# Patient Record
Sex: Female | Born: 1975 | Race: White | Hispanic: Yes | Marital: Single | Smoking: Never smoker
Health system: Southern US, Community
[De-identification: ages and names within clinical notes are randomized; demographics above are authoritative.]

## PROBLEM LIST (undated history)

## (undated) DIAGNOSIS — Z789 Other specified health status: Secondary | ICD-10-CM

## (undated) HISTORY — DX: Other specified health status: Z78.9

---

## 2007-03-16 ENCOUNTER — Inpatient Hospital Stay (HOSPITAL_COMMUNITY): Admission: AD | Admit: 2007-03-16 | Discharge: 2007-03-18 | Payer: Self-pay | Admitting: Obstetrics & Gynecology

## 2010-07-13 ENCOUNTER — Encounter: Payer: Self-pay | Admitting: Family Medicine

## 2011-04-02 LAB — CBC
HCT: 31.3 — ABNORMAL LOW
Hemoglobin: 10.8 — ABNORMAL LOW
MCHC: 34.4
RBC: 3.57 — ABNORMAL LOW
WBC: 15.4 — ABNORMAL HIGH
WBC: 20.6 — ABNORMAL HIGH

## 2017-04-21 ENCOUNTER — Other Ambulatory Visit: Payer: Self-pay | Admitting: Obstetrics & Gynecology

## 2017-04-21 DIAGNOSIS — Z1231 Encounter for screening mammogram for malignant neoplasm of breast: Secondary | ICD-10-CM

## 2017-05-24 ENCOUNTER — Ambulatory Visit
Admission: RE | Admit: 2017-05-24 | Discharge: 2017-05-24 | Disposition: A | Discharge status: Home / Self Care | Payer: No Typology Code available for payment source | Source: Ambulatory Visit | Attending: Obstetrics & Gynecology | Admitting: Obstetrics & Gynecology

## 2017-05-24 DIAGNOSIS — Z1231 Encounter for screening mammogram for malignant neoplasm of breast: Secondary | ICD-10-CM

## 2018-06-22 NOTE — L&D Delivery Note (Signed)
Delivery Note Gabrielle Drake is a 43 y.o. G2P1001 at [redacted]w[redacted]d admitted for IOL d/t AMA.  Labor course: cytotec, foley bulb, pitocin, AROM ROM: 6h 71m with clear fluid  At 2229 a viable female was delivered via spontaneous vaginal delivery (Presentation: LOA w/ compound hand) and loose nuchal x 1 reduced immediately after birth.  Infant placed directly on mom's abdomen for bonding/skin-to-skin. Delayed cord clamping x 17min, then cord clamped x 2, and cut by FOB.  APGAR: 9,9 ; weight: pending at time of note.  40 units of pitocin diluted in 1000cc LR was infused rapidly IV per protocol. The placenta separated spontaneously and delivered via CCT and maternal pushing effort.  It was inspected and appears to be intact with a 3 VC.  Placenta/Cord with the following complications: none .  Cord pH: not done  Intrapartum complications:  None Anesthesia:  epidural Episiotomy: none Lacerations:  2nd degree Suture Repair: 3.0 vicryl Est. Blood Loss (mL): 200 Sponge and instrument count were correct x2.  Mom to postpartum.  Baby to Couplet care / Skin to Skin. Placenta to L&D. Plans to breastfeed Contraception: vasectomy Circ: undecided No pp note sent ([facility])  Roma Schanz CNM, [facility] 03/28/2019 11:08 PM

## 2018-09-21 ENCOUNTER — Other Ambulatory Visit: Payer: Self-pay

## 2018-09-21 ENCOUNTER — Encounter: Payer: Self-pay | Admitting: Obstetrics & Gynecology

## 2018-09-21 ENCOUNTER — Ambulatory Visit (HOSPITAL_COMMUNITY)
Admission: RE | Admit: 2018-09-21 | Discharge: 2018-09-21 | Disposition: A | Discharge status: Home / Self Care | Payer: Self-pay | Source: Ambulatory Visit | Attending: Obstetrics & Gynecology | Admitting: Obstetrics & Gynecology

## 2018-09-21 ENCOUNTER — Ambulatory Visit (INDEPENDENT_AMBULATORY_CARE_PROVIDER_SITE_OTHER): Payer: Self-pay | Admitting: Obstetrics & Gynecology

## 2018-09-21 VITALS — BP 142/98 | HR 89 | Wt 149.8 lb

## 2018-09-21 DIAGNOSIS — Z3491 Encounter for supervision of normal pregnancy, unspecified, first trimester: Secondary | ICD-10-CM

## 2018-09-21 NOTE — Progress Notes (Signed)
   Subjective:    Patient ID: Gabrielle Drake, female    DOB: 1976-03-12, 43 y.o.   MRN: [id number]  HPI 43 yo G2P1 sent here as a referral from the health dept because they diagnosed her with an 11 week 1 day pregnancy by an u/s done on 09/19/18. She reportedly had an IUD in but the u/s done by [facility] did not show the IUD. When I discussed this with her she tells me that her LMP was extremely heavy with clots. (perhaps the IUD fell out with that period).   Review of Systems This was not a planned pregnancy, but she wants to continue with the pregnancy.   She tells me that she had a NSVD with no prenatal complications with her last/only pregnancy. Objective:   Physical Exam Breathing, conversing, and ambulating normally Well nourished, well hydrated Latina, no apparent distress  Speculum exam reveals a normal vagina and cervix with no IUD strings visible.  She underwent an u/s here at [facility] immediately after seeing me and the unofficial read is that there is NO IUD in her uterus.     Assessment & Plan:  Early pregnancy- I have rec'd that she see prenatal care at the health dept.

## 2018-09-22 ENCOUNTER — Other Ambulatory Visit: Payer: Self-pay | Admitting: Obstetrics & Gynecology

## 2018-09-22 DIAGNOSIS — Z3491 Encounter for supervision of normal pregnancy, unspecified, first trimester: Secondary | ICD-10-CM

## 2018-10-10 LAB — OB RESULTS CONSOLE HIV ANTIBODY (ROUTINE TESTING): HIV: NONREACTIVE

## 2018-10-10 LAB — OB RESULTS CONSOLE HEPATITIS B SURFACE ANTIGEN: Hepatitis B Surface Ag: NEGATIVE

## 2018-10-10 LAB — OB RESULTS CONSOLE ANTIBODY SCREEN: Antibody Screen: NEGATIVE

## 2018-10-10 LAB — OB RESULTS CONSOLE RUBELLA ANTIBODY, IGM: Rubella: IMMUNE

## 2018-10-10 LAB — OB RESULTS CONSOLE GC/CHLAMYDIA
Chlamydia: NEGATIVE
Gonorrhea: NEGATIVE

## 2018-10-10 LAB — OB RESULTS CONSOLE ABO/RH: RH Type: POSITIVE

## 2018-10-10 LAB — OB RESULTS CONSOLE RPR: RPR: NONREACTIVE

## 2018-10-11 ENCOUNTER — Other Ambulatory Visit (HOSPITAL_COMMUNITY): Payer: Self-pay | Admitting: Nurse Practitioner

## 2018-10-14 ENCOUNTER — Other Ambulatory Visit: Payer: Self-pay

## 2018-10-14 ENCOUNTER — Encounter (HOSPITAL_COMMUNITY): Payer: Self-pay | Admitting: Nurse Practitioner

## 2018-10-14 ENCOUNTER — Ambulatory Visit (HOSPITAL_COMMUNITY): Payer: Self-pay | Admitting: Obstetrics and Gynecology

## 2018-10-14 ENCOUNTER — Ambulatory Visit (HOSPITAL_COMMUNITY): Payer: No Typology Code available for payment source | Attending: Obstetrics and Gynecology

## 2018-10-14 NOTE — Progress Notes (Signed)
Pt is in genetic counseling with [facility], [facility].

## 2019-03-13 LAB — OB RESULTS CONSOLE GBS: GBS: NEGATIVE

## 2019-03-13 LAB — OB RESULTS CONSOLE GC/CHLAMYDIA
Chlamydia: NEGATIVE
Gonorrhea: NEGATIVE

## 2019-03-27 ENCOUNTER — Other Ambulatory Visit (HOSPITAL_COMMUNITY)
Admission: RE | Admit: 2019-03-27 | Discharge: 2019-03-27 | Disposition: A | Discharge status: Home / Self Care | Payer: No Typology Code available for payment source | Source: Ambulatory Visit | Attending: Obstetrics and Gynecology | Admitting: Obstetrics and Gynecology

## 2019-03-27 ENCOUNTER — Encounter (HOSPITAL_COMMUNITY): Payer: Self-pay | Admitting: *Deleted

## 2019-03-27 ENCOUNTER — Telehealth (HOSPITAL_COMMUNITY): Payer: Self-pay | Admitting: *Deleted

## 2019-03-27 ENCOUNTER — Other Ambulatory Visit: Payer: Self-pay

## 2019-03-27 ENCOUNTER — Other Ambulatory Visit (HOSPITAL_COMMUNITY): Payer: Self-pay | Admitting: Advanced Practice Midwife

## 2019-03-27 DIAGNOSIS — Z01812 Encounter for preprocedural laboratory examination: Secondary | ICD-10-CM | POA: Insufficient documentation

## 2019-03-27 DIAGNOSIS — Z20828 Contact with and (suspected) exposure to other viral communicable diseases: Secondary | ICD-10-CM | POA: Insufficient documentation

## 2019-03-27 LAB — SARS CORONAVIRUS 2 BY RT PCR (HOSPITAL ORDER, PERFORMED IN ~~LOC~~ HOSPITAL LAB): SARS Coronavirus 2: NEGATIVE

## 2019-03-27 NOTE — Telephone Encounter (Signed)
Preadmission screen  

## 2019-03-27 NOTE — MAU Note (Signed)
Covid swab collected Pt tolerated well. Asymptomatic 

## 2019-03-28 ENCOUNTER — Inpatient Hospital Stay (HOSPITAL_COMMUNITY): Payer: Medicaid Other

## 2019-03-28 ENCOUNTER — Inpatient Hospital Stay (HOSPITAL_COMMUNITY)
Admission: AD | Admit: 2019-03-28 | Discharge: 2019-03-30 | DRG: 807 | Disposition: A | Discharge status: Home / Self Care | Payer: Medicaid Other | Attending: Family Medicine | Admitting: Family Medicine

## 2019-03-28 ENCOUNTER — Inpatient Hospital Stay (HOSPITAL_COMMUNITY): Payer: Medicaid Other | Admitting: Anesthesiology

## 2019-03-28 ENCOUNTER — Encounter (HOSPITAL_COMMUNITY): Payer: Self-pay | Admitting: *Deleted

## 2019-03-28 DIAGNOSIS — Z3A39 39 weeks gestation of pregnancy: Secondary | ICD-10-CM

## 2019-03-28 DIAGNOSIS — Z20828 Contact with and (suspected) exposure to other viral communicable diseases: Secondary | ICD-10-CM | POA: Diagnosis present

## 2019-03-28 DIAGNOSIS — O326XX0 Maternal care for compound presentation, not applicable or unspecified: Secondary | ICD-10-CM

## 2019-03-28 DIAGNOSIS — O26893 Other specified pregnancy related conditions, third trimester: Secondary | ICD-10-CM | POA: Diagnosis present

## 2019-03-28 LAB — TYPE AND SCREEN
ABO/RH(D): B POS
Antibody Screen: NEGATIVE

## 2019-03-28 LAB — CBC
HCT: 38.5 % (ref 36.0–46.0)
Hemoglobin: 12.7 g/dL (ref 12.0–15.0)
MCH: 28.9 pg (ref 26.0–34.0)
MCHC: 33 g/dL (ref 30.0–36.0)
MCV: 87.5 fL (ref 80.0–100.0)
Platelets: 190 10*3/uL (ref 150–400)
RBC: 4.4 MIL/uL (ref 3.87–5.11)
RDW: 14.3 % (ref 11.5–15.5)
WBC: 11.5 10*3/uL — ABNORMAL HIGH (ref 4.0–10.5)
nRBC: 0 % (ref 0.0–0.2)

## 2019-03-28 LAB — RPR: RPR Ser Ql: NONREACTIVE

## 2019-03-28 LAB — ABO/RH: ABO/RH(D): B POS

## 2019-03-28 MED ORDER — LACTATED RINGERS IV SOLN
INTRAVENOUS | Status: DC
  Administered 2019-03-28 (×2): via INTRAVENOUS

## 2019-03-28 MED ORDER — LACTATED RINGERS IV SOLN: 500.0000 mL | INTRAVENOUS | Status: DC | PRN

## 2019-03-28 MED ORDER — FENTANYL CITRATE (PF) 100 MCG/2ML IJ SOLN: 50.0000 ug | INTRAMUSCULAR | Status: DC | PRN

## 2019-03-28 MED ORDER — EPHEDRINE 5 MG/ML INJ: 10.0000 mg | INTRAVENOUS | Status: DC | PRN

## 2019-03-28 MED ORDER — DIPHENHYDRAMINE HCL 50 MG/ML IJ SOLN: 12.5000 mg | INTRAMUSCULAR | Status: DC | PRN

## 2019-03-28 MED ORDER — SOD CITRATE-CITRIC ACID 500-334 MG/5ML PO SOLN: 30.0000 mL | ORAL | Status: DC | PRN

## 2019-03-28 MED ORDER — FENTANYL-BUPIVACAINE-NACL 0.5-0.125-0.9 MG/250ML-% EP SOLN
12.0000 mL/h | EPIDURAL | Status: DC | PRN
  Filled 2019-03-28: qty 250

## 2019-03-28 MED ORDER — LACTATED RINGERS IV SOLN
500.0000 mL | Freq: Once | INTRAVENOUS | Status: AC
  Administered 2019-03-28: 500 mL via INTRAVENOUS

## 2019-03-28 MED ORDER — HYDROXYZINE HCL 50 MG PO TABS: 50.0000 mg | ORAL_TABLET | Freq: Four times a day (QID) | ORAL | Status: DC | PRN

## 2019-03-28 MED ORDER — MISOPROSTOL 50MCG HALF TABLET
50.0000 ug | ORAL_TABLET | ORAL | Status: DC
  Administered 2019-03-28: 10:00:00 50 ug via BUCCAL

## 2019-03-28 MED ORDER — OXYCODONE-ACETAMINOPHEN 5-325 MG PO TABS: 2.0000 | ORAL_TABLET | ORAL | Status: DC | PRN

## 2019-03-28 MED ORDER — MISOPROSTOL 25 MCG QUARTER TABLET
25.0000 ug | ORAL_TABLET | ORAL | Status: DC | PRN
  Administered 2019-03-28 (×2): 25 ug via VAGINAL
  Filled 2019-03-28 (×2): qty 1

## 2019-03-28 MED ORDER — SODIUM CHLORIDE (PF) 0.9 % IJ SOLN
INTRAMUSCULAR | Status: DC | PRN
  Administered 2019-03-28: 12 mL/h via EPIDURAL

## 2019-03-28 MED ORDER — ONDANSETRON HCL 4 MG/2ML IJ SOLN: 4.0000 mg | Freq: Four times a day (QID) | INTRAMUSCULAR | Status: DC | PRN

## 2019-03-28 MED ORDER — LIDOCAINE HCL (PF) 1 % IJ SOLN: 30.0000 mL | INTRAMUSCULAR | Status: DC | PRN

## 2019-03-28 MED ORDER — TERBUTALINE SULFATE 1 MG/ML IJ SOLN: 0.2500 mg | Freq: Once | INTRAMUSCULAR | Status: DC | PRN

## 2019-03-28 MED ORDER — OXYCODONE-ACETAMINOPHEN 5-325 MG PO TABS: 1.0000 | ORAL_TABLET | ORAL | Status: DC | PRN

## 2019-03-28 MED ORDER — OXYTOCIN 40 UNITS IN NORMAL SALINE INFUSION - SIMPLE MED
1.0000 m[IU]/min | INTRAVENOUS | Status: DC
  Administered 2019-03-28: 2 m[IU]/min via INTRAVENOUS
  Filled 2019-03-28: qty 1000

## 2019-03-28 MED ORDER — PHENYLEPHRINE 40 MCG/ML (10ML) SYRINGE FOR IV PUSH (FOR BLOOD PRESSURE SUPPORT): 80.0000 ug | PREFILLED_SYRINGE | INTRAVENOUS | Status: DC | PRN

## 2019-03-28 MED ORDER — ACETAMINOPHEN 325 MG PO TABS: 650.0000 mg | ORAL_TABLET | ORAL | Status: DC | PRN

## 2019-03-28 MED ORDER — MISOPROSTOL 50MCG HALF TABLET
ORAL_TABLET | ORAL | Status: AC
  Filled 2019-03-28: qty 1

## 2019-03-28 MED ORDER — FLEET ENEMA 7-19 GM/118ML RE ENEM: 1.0000 | ENEMA | RECTAL | Status: DC | PRN

## 2019-03-28 MED ORDER — LIDOCAINE HCL (PF) 1 % IJ SOLN
INTRAMUSCULAR | Status: DC | PRN
  Administered 2019-03-28 (×2): 5 mL via EPIDURAL

## 2019-03-28 MED ORDER — OXYTOCIN 40 UNITS IN NORMAL SALINE INFUSION - SIMPLE MED: 2.5000 [IU]/h | INTRAVENOUS | Status: DC

## 2019-03-28 MED ORDER — OXYTOCIN BOLUS FROM INFUSION
500.0000 mL | Freq: Once | INTRAVENOUS | Status: AC
  Administered 2019-03-28: 23:00:00 500 mL via INTRAVENOUS

## 2019-03-28 NOTE — Progress Notes (Signed)
Gabrielle Drake is a 43 y.o. G2P1001 at [redacted]w[redacted]d by admitted for IOL due to [facility].  Subjective: Reports feeling comfortable with epidural, able to feel some contractions. FOB supportive at bedside.  Objective: Vitals:   03/28/19 1716 03/28/19 1721 03/28/19 1801 03/28/19 1831  BP: 120/79 118/76 107/65 117/68  Pulse: 82 86 77 78  Resp:      Temp:      TempSrc:      Weight:      Height:        No intake/output data recorded. No intake/output data recorded.   FHT:  FHR: 135 bpm, variability: moderate,  accelerations:  Present,  decelerations:  Present early decels with ctx UC:   regular, every 4-7 minutes SVE:   Dilation: 6 Effacement (%): 80 Station: -2 Exam by:: Theadora Rama, MD  Labs:   Recent Labs    03/28/19 0029  WBC 11.5*  HGB 12.7  HCT 38.5  PLT 190    Assessment / Plan: 43 y.o. G2P1001 at [redacted]w[redacted]d here for IOL for AMA  Labor: Progressing well on Pitocin, will continue to titrate per protocol. Preeclampsia:  no signs or symptoms of toxicity and labs stable Fetal Wellbeing:  Category I Pain Control:  Epidural I/D:  GBS negative Anticipated MOD:  NSVD  Gabrielle Drake, CNM, BSN 03/28/2019, 7:27 PM

## 2019-03-28 NOTE — Progress Notes (Signed)
  Foley Bulb Insertion Procedure: Patient informed of R/B/A of procedure. NST was performed and was reactive prior to procedure. NST:  EFM: Baseline: 130 bpm, moderate variability with 15x15 accels, no decels.  Toco: none Procedure done to begin ripening of the cervix prior to admission for induction of labor. Appropriate time out taken. The patient was placed in the lithotomy position and a cervical exam was performed and a finger was used to guide the 44F foley balloon through the internal os of the cervix. Foley Balloon filled with 60cc of normal saline. Plug inserted into end of the foley. Foley placed on tension and taped to medial thigh.  NST:  EFM Baseline: 130 bpm, Variability: Good {> 6 bpm), Accelerations: Reactive and Decelerations: Absent  Toco: none There were no signs of tachysystole or hypertonus. All equipment was removed and accounted for. The patient tolerated the procedure well.    Marcille Buffy DNP, CNM  03/28/19  9:51 AM

## 2019-03-28 NOTE — Anesthesia Procedure Notes (Signed)
Epidural Patient location during procedure: OB Start time: 03/28/2019 4:57 PM End time: 03/28/2019 5:12 PM  Staffing Anesthesiologist: Duane Boston, MD Performed: anesthesiologist   Preanesthetic Checklist Completed: patient identified, site marked, pre-op evaluation, timeout performed, IV checked, risks and benefits discussed and monitors and equipment checked  Epidural Patient position: sitting Prep: DuraPrep Patient monitoring: heart rate, cardiac monitor, continuous pulse ox and blood pressure Approach: midline Location: L2-L3 Injection technique: LOR saline  Needle:  Needle type: Tuohy  Needle gauge: 17 G Needle length: 9 cm Needle insertion depth: 6 cm Catheter size: 20 Guage Catheter at skin depth: 11 cm Test dose: negative and Other  Assessment Events: blood not aspirated, injection not painful, no injection resistance and negative IV test  Additional Notes Informed consent obtained prior to proceeding including risk of failure, 1% risk of PDPH, risk of minor discomfort and bruising.  Discussed rare but serious complications including epidural abscess, permanent nerve injury, epidural hematoma.  Discussed alternatives to epidural analgesia and patient desires to proceed.  Timeout performed pre-procedure verifying patient name, procedure, and platelet count.  Patient tolerated procedure well.

## 2019-03-28 NOTE — Progress Notes (Signed)
Gabrielle Drake is a 43 y.o. G2P1001 at [redacted]w[redacted]d admitted for induction of labor due to [facility].  Subjective:  No complaints. Starting to feel some contractions.  Objective: BP 112/74   Pulse 86   Temp 98.4 F (36.9 C)   Resp 18   Ht 5\' 1"  (1.549 m)   Wt 83.8 kg   LMP 06/27/2018 (Approximate)   BMI 34.92 kg/m  No intake/output data recorded. No intake/output data recorded.  FHT:  FHR: 145 bpm, variability: moderate,  accelerations:  Present,  decelerations:  Absent UC:   irregular, every 3-7 minutes SVE:   Dilation: 4 Effacement (%): 60 Station: -2 Exam by:: m wilkins rnc AROM for clear fluid    Labs: Lab Results  Component Value Date   WBC 11.5 (H) 03/28/2019   HGB 12.7 03/28/2019   HCT 38.5 03/28/2019   MCV 87.5 03/28/2019   PLT 190 03/28/2019    Assessment / Plan: Induction of labor due to [place],  progressing well on pitocin  Labor: AROM at this time for clear fluid, will continue to increase pitocin Preeclampsia:  NA Fetal Wellbeing:  Category I Pain Control:  Labor support without medications I/D:  n/a Anticipated MOD:  NSVD  Marcille Buffy DNP, CNM  03/28/19  4:19 PM

## 2019-03-28 NOTE — Anesthesia Preprocedure Evaluation (Signed)
Anesthesia Evaluation  Patient identified by MRN, date of birth, ID band Patient awake    Reviewed: Allergy & Precautions, NPO status , Patient's Chart, lab work & pertinent test results  Airway Mallampati: II  TM Distance: >3 FB Neck ROM: Full    Dental no notable dental hx. (+) Dental Advisory Given   Pulmonary neg pulmonary ROS,    Pulmonary exam normal        Cardiovascular negative cardio ROS Normal cardiovascular exam     Neuro/Psych negative neurological ROS  negative psych ROS   GI/Hepatic negative GI ROS, Neg liver ROS,   Endo/Other  negative endocrine ROS  Renal/GU negative Renal ROS  negative genitourinary   Musculoskeletal negative musculoskeletal ROS (+)   Abdominal   Peds negative pediatric ROS (+)  Hematology negative hematology ROS (+)   Anesthesia Other Findings   Reproductive/Obstetrics (+) Pregnancy                             Anesthesia Physical Anesthesia Plan  ASA: II  Anesthesia Plan: Epidural   Post-op Pain Management:    Induction:   PONV Risk Score and Plan: Treatment may vary due to age or medical condition  Airway Management Planned: Natural Airway  Additional Equipment:   Intra-op Plan:   Post-operative Plan:   Informed Consent: I have reviewed the patients History and Physical, chart, labs and discussed the procedure including the risks, benefits and alternatives for the proposed anesthesia with the patient or authorized representative who has indicated his/her understanding and acceptance.     Dental advisory given  Plan Discussed with: Anesthesiologist  Anesthesia Plan Comments:         Anesthesia Quick Evaluation

## 2019-03-28 NOTE — H&P (Addendum)
LABOR AND DELIVERY ADMISSION HISTORY AND PHYSICAL NOTE  Gabrielle Drake is a 43 y.o. female G2P1001 with IUP at [redacted]w[redacted]d by u/s presenting for IOL for AMA. EFW 2102g (45.7%) at [redacted]w[redacted]d.  History of one vaginal delivery in 2008 with a 7lb female. She reports positive fetal movement. She denies leakage of fluid or vaginal bleeding.  Prenatal History/Complications: PNC at [facility] Pregnancy complications:  - AMA  - Low lying placenta on ultrasound - Unable to visualize RV cardiac outflow track  Past Medical History: Past Medical History:  Diagnosis Date  . No pertinent past medical history     Past Surgical History: History reviewed. No pertinent surgical history.  Obstetrical History: OB History    Gravida  2   Para  1   Term  1   Preterm  0   AB  0   Living  1     SAB  0   TAB  0   Ectopic  0   Multiple  0   Live Births  1           Social History: Social History   Socioeconomic History  . Marital status: Married    Spouse name: Not on file  . Number of children: Not on file  . Years of education: Not on file  . Highest education level: Not on file  Occupational History  . Not on file  Social Needs  . Financial resource strain: Not on file  . Food insecurity    Worry: Not on file    Inability: Not on file  . Transportation needs    Medical: Not on file    Non-medical: Not on file  Tobacco Use  . Smoking status: Never Smoker  . Smokeless tobacco: Never Used  Substance and Sexual Activity  . Alcohol use: Never    Frequency: Never  . Drug use: Never  . Sexual activity: Yes  Lifestyle  . Physical activity    Days per week: Not on file    Minutes per session: Not on file  . Stress: Not on file  Relationships  . Social Herbalist on phone: Not on file    Gets together: Not on file    Attends religious service: Not on file    Active member of club or organization: Not on file    Attends meetings of clubs or organizations: Not  on file    Relationship status: Not on file  Other Topics Concern  . Not on file  Social History Narrative  . Not on file    Family History: Family History  Problem Relation Age of Onset  . Breast cancer Mother 12    Allergies: No Known Allergies  Medications Prior to Admission  Medication Sig Dispense Refill Last Dose  . Prenatal Vit-Fe Fumarate-FA (MULTIVITAMIN-PRENATAL) 27-0.8 MG TABS tablet Take 1 tablet by mouth daily at 12 noon.   03/28/2019 at Unknown time     Review of Systems  All systems reviewed and negative except as stated in HPI  Physical Exam Blood pressure 111/67, pulse 91, temperature 99.4 F (37.4 C), temperature source Oral, resp. rate 18, height 5\' 1"  (1.549 m), weight 83.8 kg, last menstrual period 06/27/2018. General appearance: alert, oriented, NAD Lungs: normal respiratory effort Heart: regular rate Abdomen: soft, non-tender; gravid, FH appropriate for GA Extremities: No calf swelling or tenderness Presentation: cephalic Fetal monitoring: baseline rate 135, moderate variability, + acels, no decels Uterine activity: variable  Dilation: 1cm Effacement: Thick  Position: Posterior  Prenatal labs: ABO, Rh: --/--/PENDING (10/06 3267) Antibody: PENDING (10/06 0029) Rubella: Immune (04/20 0000) RPR: Nonreactive (04/20 0000)  HBsAg: Negative (04/20 0000)  HIV: Non-reactive (04/20 0000)  GC/Chlamydia: Negative GBS: Negative/-- (09/21 0000)  2-hr GTT: WNL Genetic screening:  CF neg, Declined remaning Anatomy US: WNL  Prenatal Transfer Tool  Maternal Diabetes: No Genetic Screening: Declined Maternal Ultrasounds/Referrals: Other: Unable to visualize RV outflow track on 20w ultrasound. Never followed up. Fetal Ultrasounds or other Referrals:  None Maternal Substance Abuse:  No Significant Maternal Medications:  None Significant Maternal Lab Results: Group B Strep negative  Results for orders placed or performed during the hospital encounter of  03/28/19 (from the past 24 hour(s))  CBC   Collection Time: 03/28/19 12:29 AM  Result Value Ref Range   WBC 11.5 (H) 4.0 - 10.5 K/uL   RBC 4.40 3.87 - 5.11 MIL/uL   Hemoglobin 12.7 12.0 - 15.0 g/dL   HCT 12.4 58.0 - 99.8 %   MCV 87.5 80.0 - 100.0 fL   MCH 28.9 26.0 - 34.0 pg   MCHC 33.0 30.0 - 36.0 g/dL   RDW 33.8 25.0 - 53.9 %   Platelets 190 150 - 400 K/uL   nRBC 0.0 0.0 - 0.2 %  Type and screen   Collection Time: 03/28/19 12:29 AM  Result Value Ref Range   ABO/RH(D) PENDING    Antibody Screen PENDING    Sample Expiration      03/31/2019,2359 Performed at [facility], [address], [place], [place] [address]   Results for orders placed or performed during the hospital encounter of 03/27/19 (from the past 24 hour(s))  SARS Coronavirus 2 [facility] order, Performed in [facility] hospital lab) Nasopharyngeal Nasopharyngeal Swab   Collection Time: 03/27/19  2:27 PM   Specimen: Nasopharyngeal Swab  Result Value Ref Range   SARS Coronavirus 2 NEGATIVE NEGATIVE    Patient Active Problem List   Diagnosis Date Noted  . Indication for care in labor or delivery 03/28/2019    Assessment: Darrah Gabrielle Drake is a 43 y.o. G2P1001 at [redacted]w[redacted]d here for IOL for AMA  #Labor: Cytotec x 1 at 0037. Will plan to recheck in 4 hours.  #Pain: Desires epidural #FWB: Cat I #ID:  GBS neg  Postpartum Planning: - boy (unsure) / breast / Condoms-->Vasectomy  Gabrielle Drake 03/28/2019, 1:09 AM  OB FELLOW ATTESTATION  I have seen and examined this patient and agree with above documentation in the resident's note except as noted below.  43 y/o G2P1 at [redacted]w[redacted]d here for AMA IOL. Reports prior vaginal delivery of 7lb baby, uncomplicated. GBS negative, B+ blood type. Prenatal ultrasounds initially w low lying placenta that resolved, also w suboptimal cardiac views that do not appear to have been followed up but otherwise normal. Leopolds estimate 3500g. Per Dr. Mauri Reading exam internal os  closed and unable to place foley, vaginal miso placed with plan to recheck in 4 hours to reassess for foley. Patient uncertain regarding circumcision, will discuss more with partner. Reviewed long duration for vasectomy to be fully effective and recommendation to have additional backup method in meantime, plans to use condoms until partner able to get procedure.  Zack Seal, MD/[facility] OB Fellow  03/28/2019, 1:20 AM

## 2019-03-28 NOTE — Progress Notes (Addendum)
Gabrielle Drake is a 43 y.o. G2P1001 at [redacted]w[redacted]d admitted for IOL for AMA  Subjective: Reports felling well and comfortable at this time. No complaints. Up walking around room with FOB supportive at bedside. Is planning for epidural.  Objective: Vitals:   03/28/19 0420 03/28/19 0540 03/28/19 0608 03/28/19 0651  BP: 103/69 114/71 115/77 105/71  Pulse: 91 81 81 87  Resp: 18 16 18 16   Temp:   98.4 F (36.9 C)   TempSrc:   Oral   Weight:      Height:        No intake/output data recorded. No intake/output data recorded.   FHT:  FHR: 125 bpm, variability: moderate,  accelerations:  Present,  decelerations:  Absent UC:   irregular SVE:   Dilation: 1.5 Effacement (%): 50 Station: -2 Exam by:: m wilkins rnc  Labs:   Recent Labs    03/28/19 0029  WBC 11.5*  HGB 12.7  HCT 38.5  PLT 190    Assessment / Plan: 43 y.o. G2P1001 at [redacted]w[redacted]d here for IOL for AMA  Labor: Plan for foley bulb insertion this AM, assess for Pitocin following foley expulsion. Preeclampsia:  no signs or symptoms of toxicity and labs stable Fetal Wellbeing:  Category I Pain Control:  Planning for epidural I/D:  GBS negative Anticipated MOD:  NSVD  Gabrielle Drake, CNM, BSN 03/28/2019, 9:34 AM

## 2019-03-28 NOTE — Discharge Summary (Signed)
Postpartum Discharge Summary       Patient Name: Gabrielle Drake DOB: [birth year] MRN: [id number]  Date of admission: 03/28/2019 Delivering Provider: Wells Guiles R   Date of discharge: 03/30/2019  Admitting diagnosis: PREG Intrauterine pregnancy: [redacted]w[redacted]d    Secondary diagnosis:  Active Problems:   Indication for care in labor or delivery  Additional problems: AMA     Discharge diagnosis: Term Pregnancy Delivered                                                                                                Post partum procedures:None  Augmentation: AROM, Pitocin, Cytotec and Foley Balloon  Complications: None  Hospital course:  Induction of Labor With Vaginal Delivery   43y.o. yo G2P1001 at 344w1das admitted to the hospital 03/28/2019 for induction of labor.  Indication for induction: AMA.  Patient had an uncomplicated labor course as follows: Membrane Rupture Time/Date: 4:04 PM ,03/28/2019   Intrapartum Procedures: Episiotomy: None [1]                                         Lacerations:  2nd degree [3]  Patient had delivery of a Viable infant.  Information for the patient's newborn:  MeFlorentina, Marquart0[[id number]]Delivery Method: Vaginal, Spontaneous(Filed from Delivery Summary)    03/28/2019  Details of delivery can be found in separate delivery note.  Patient had a routine postpartum course. Patient is discharged home 03/30/19. Delivery time: 10:29 PM    Magnesium Sulfate received: No BMZ received: No Rhophylac:N/A MMR:N/A Transfusion:No  Physical exam  Vitals:   03/29/19 1300 03/29/19 1430 03/29/19 2159 03/30/19 0523  BP: 112/68 110/68 104/63 119/81  Pulse: 68 77 73 66  Resp: 18 16 18 18   Temp: 98.2 F (36.8 C) 98.4 F (36.9 C) 98 F (36.7 C) 98 F (36.7 C)  TempSrc: Oral Oral Oral   SpO2: 98%  100% 99%  Weight:      Height:       General: alert, cooperative and no distress Lochia: appropriate Uterine Fundus: firm Incision:  N/A DVT Evaluation: No evidence of DVT seen on physical exam. Labs: Lab Results  Component Value Date   WBC 11.5 (H) 03/28/2019   HGB 12.7 03/28/2019   HCT 38.5 03/28/2019   MCV 87.5 03/28/2019   PLT 190 03/28/2019   No flowsheet data found.  Discharge instruction: per After Visit Summary and "Baby and Me Booklet".  After visit meds:  Allergies as of 03/30/2019   No Known Allergies     Medication List    TAKE these medications   ibuprofen 600 MG tablet Commonly known as: ADVIL Take 1 tablet (600 mg total) by mouth every 6 (six) hours.       Diet: routine diet  Activity: Advance as tolerated. Pelvic rest for 6 weeks.   Outpatient follow up:4 weeks Follow up Appt:No future appointments. Follow up Visit: Follow-up Information    [facility]  [facility].   Why: for 4 weeks Contact information: [address] [place] [address] [phone]          PP note not sent: [facility]   Newborn Data: Live born female  Birth Weight:   APGAR: 50, 9  Newborn Delivery   Birth date/time: 03/28/2019 22:29:00 Delivery type: Vaginal, Spontaneous      Baby Feeding: Breast Disposition:home with mother   03/30/2019 Gifford Shave, MD

## 2019-03-28 NOTE — Progress Notes (Signed)
LABOR PROGRESS NOTE  Gabrielle Drake is a 43 y.o. G2P1001 at [redacted]w[redacted]d  admitted for IOL for AMA  Subjective: Patient doing well. Feeling some occasional discomfort but no strong contractions yet. Otherwise no complaints or concerns.  Objective: BP 103/69   Pulse 91   Temp 99.4 F (37.4 C) (Oral)   Resp 18   Ht 5\' 1"  (1.549 m)   Wt 83.8 kg   LMP 06/27/2018 (Approximate)   BMI 34.92 kg/m  or  Vitals:   03/28/19 0118 03/28/19 0217 03/28/19 0317 03/28/19 0420  BP: 104/66 (!) 91/59 97/67 103/69  Pulse: 82 75 85 91  Resp: 18 16 16 18   Temp:      TempSrc:      Weight:      Height:        Dilation: 1 Effacement (%): Thick Cervical Position: Posterior Station: -3 Presentation: Vertex Exam by:: Shela Nevin, RN FHT: baseline rate 135, moderate varibility, + acel, no decel Toco: occasional  Labs: Lab Results  Component Value Date   WBC 11.5 (H) 03/28/2019   HGB 12.7 03/28/2019   HCT 38.5 03/28/2019   MCV 87.5 03/28/2019   PLT 190 03/28/2019    Patient Active Problem List   Diagnosis Date Noted  . Indication for care in labor or delivery 03/28/2019    Assessment / Plan: 43 y.o. G2P1001 at [redacted]w[redacted]d here for IOL for AMA  Labor: Cervix is soft but internal os continues to remain closed. Minimal progression since prior cytotec. Will given another vaginal Cytotec. May consider oral cytotec vs foley bulb in 4 hours. Fetal Wellbeing:  Cat 1 Pain Control:  Desires epidural Anticipated MOD:  NSVD  Mina Marble, D.O. [place], PGY2 03/28/2019, 5:32 AM

## 2019-03-29 MED ORDER — SODIUM CHLORIDE 0.9% FLUSH: 3.0000 mL | Freq: Two times a day (BID) | INTRAVENOUS | Status: DC

## 2019-03-29 MED ORDER — SODIUM CHLORIDE 0.9 % IV SOLN: 250.0000 mL | INTRAVENOUS | Status: DC | PRN

## 2019-03-29 MED ORDER — BISACODYL 10 MG RE SUPP: 10.0000 mg | Freq: Every day | RECTAL | Status: DC | PRN

## 2019-03-29 MED ORDER — ONDANSETRON HCL 4 MG PO TABS: 4.0000 mg | ORAL_TABLET | ORAL | Status: DC | PRN

## 2019-03-29 MED ORDER — SODIUM CHLORIDE 0.9% FLUSH: 3.0000 mL | INTRAVENOUS | Status: DC | PRN

## 2019-03-29 MED ORDER — FLEET ENEMA 7-19 GM/118ML RE ENEM: 1.0000 | ENEMA | Freq: Every day | RECTAL | Status: DC | PRN

## 2019-03-29 MED ORDER — DIBUCAINE (PERIANAL) 1 % EX OINT: 1.0000 "application " | TOPICAL_OINTMENT | CUTANEOUS | Status: DC | PRN

## 2019-03-29 MED ORDER — SENNOSIDES-DOCUSATE SODIUM 8.6-50 MG PO TABS
2.0000 | ORAL_TABLET | ORAL | Status: DC
  Administered 2019-03-29 (×2): 2 via ORAL
  Filled 2019-03-29 (×2): qty 2

## 2019-03-29 MED ORDER — ONDANSETRON HCL 4 MG/2ML IJ SOLN: 4.0000 mg | INTRAMUSCULAR | Status: DC | PRN

## 2019-03-29 MED ORDER — SIMETHICONE 80 MG PO CHEW
80.0000 mg | CHEWABLE_TABLET | ORAL | Status: DC | PRN
  Filled 2019-03-29: qty 1

## 2019-03-29 MED ORDER — MEASLES, MUMPS & RUBELLA VAC IJ SOLR: 0.5000 mL | Freq: Once | INTRAMUSCULAR | Status: DC

## 2019-03-29 MED ORDER — WITCH HAZEL-GLYCERIN EX PADS: 1.0000 "application " | MEDICATED_PAD | CUTANEOUS | Status: DC | PRN

## 2019-03-29 MED ORDER — ACETAMINOPHEN 325 MG PO TABS
650.0000 mg | ORAL_TABLET | ORAL | Status: DC | PRN
  Administered 2019-03-29: 650 mg via ORAL
  Filled 2019-03-29: qty 2

## 2019-03-29 MED ORDER — IBUPROFEN 600 MG PO TABS
600.0000 mg | ORAL_TABLET | Freq: Four times a day (QID) | ORAL | Status: DC
  Administered 2019-03-29 – 2019-03-30 (×6): 600 mg via ORAL
  Filled 2019-03-29 (×6): qty 1

## 2019-03-29 MED ORDER — DIPHENHYDRAMINE HCL 25 MG PO CAPS: 25.0000 mg | ORAL_CAPSULE | Freq: Four times a day (QID) | ORAL | Status: DC | PRN

## 2019-03-29 MED ORDER — COCONUT OIL OIL: 1.0000 "application " | TOPICAL_OIL | Status: DC | PRN

## 2019-03-29 MED ORDER — PRENATAL MULTIVITAMIN CH
1.0000 | ORAL_TABLET | Freq: Every day | ORAL | Status: DC
  Administered 2019-03-29: 1 via ORAL
  Filled 2019-03-29: qty 1

## 2019-03-29 MED ORDER — BENZOCAINE-MENTHOL 20-0.5 % EX AERO
1.0000 "application " | INHALATION_SPRAY | CUTANEOUS | Status: DC | PRN
  Administered 2019-03-29: 1 via TOPICAL
  Filled 2019-03-29: qty 56

## 2019-03-29 MED ORDER — ZOLPIDEM TARTRATE 5 MG PO TABS: 5.0000 mg | ORAL_TABLET | Freq: Every evening | ORAL | Status: DC | PRN

## 2019-03-29 MED ORDER — TETANUS-DIPHTH-ACELL PERTUSSIS 5-2.5-18.5 LF-MCG/0.5 IM SUSP: 0.5000 mL | Freq: Once | INTRAMUSCULAR | Status: DC

## 2019-03-29 NOTE — Progress Notes (Cosign Needed)
POSTPARTUM PROGRESS NOTE  Post Partum Day 1  Subjective:  Gabrielle Drake is a 43 y.o. B2W4132 s/p NSVD at [redacted]w[redacted]d.  She reports she is doing well. No acute events overnight. She denies any problems with ambulating, voiding or po intake. Denies nausea or vomiting.  Pain is well controlled.  Lochia is appropriate.  Objective: Blood pressure 101/66, pulse 70, temperature 98 F (36.7 C), temperature source Oral, resp. rate 17, height 5\' 1"  (1.549 m), weight 83.8 kg, last menstrual period 06/27/2018, SpO2 100 %, unknown if currently breastfeeding.  Physical Exam:  General: alert, cooperative and no distress Chest: no respiratory distress Heart:regular rate, distal pulses intact Abdomen: soft, nontender,  Uterine Fundus: firm, appropriately tender DVT Evaluation: No calf swelling or tenderness Extremities: no LE edema Skin: warm, dry  Recent Labs    03/28/19 0029  HGB 12.7  HCT 38.5    Assessment/Plan: Gabrielle Drake is a 43 y.o. G4W1027 s/p NSVD at [redacted]w[redacted]d   PPD#1 - Doing well  Routine postpartum care Contraception: partner to get vasectomy Feeding: breast and bottle Dispo: Plan for discharge PPD#2.   LOS: 1 day   Augustin Coupe, MD/[facility] OB Fellow  03/29/2019, 8:34 AM

## 2019-03-29 NOTE — Anesthesia Postprocedure Evaluation (Signed)
Anesthesia Post Note  Patient: Gabrielle Drake  Procedure(s) Performed: AN AD HOC LABOR EPIDURAL     Patient location during evaluation: Mother Baby Anesthesia Type: Epidural Level of consciousness: awake and alert Pain management: pain level controlled Vital Signs Assessment: post-procedure vital signs reviewed and stable Respiratory status: spontaneous breathing, nonlabored ventilation and respiratory function stable Cardiovascular status: stable Postop Assessment: no headache, no backache and epidural receding Anesthetic complications: no    Last Vitals:  Vitals:   03/29/19 0101 03/29/19 0500  BP: 126/74 112/72  Pulse: 78 82  Resp: 18 18  Temp: 37.3 C 36.9 C  SpO2: 100% 100%    Last Pain:  Vitals:   03/29/19 0605  TempSrc:   PainSc: 2    Pain Goal:                   [employer]

## 2019-03-30 DIAGNOSIS — O326XX0 Maternal care for compound presentation, not applicable or unspecified: Secondary | ICD-10-CM

## 2019-03-30 DIAGNOSIS — Z3A39 39 weeks gestation of pregnancy: Secondary | ICD-10-CM

## 2019-03-30 MED ORDER — IBUPROFEN 600 MG PO TABS: 600.0000 mg | ORAL_TABLET | Freq: Four times a day (QID) | ORAL | 0 refills | Status: AC

## 2019-03-30 NOTE — Lactation Note (Signed)
This note was copied from a baby's chart. Lactation Consultation Note Experienced BF mom of 1 yr to her 41 yr old. Mom stated baby is BF good. Having no difficulty. Mom BF in cradle position. Mom has "V" shaped breast w/short shaft compressible nipples. Mom massage breast at intervals. Newborn behavior, STS, positioning, hand expression, I&O, support discussed. Mom has no questions at this time. States BF going well. Lactation brochure given. Encouraged to call for assistance if needed.  Patient Name: Gabrielle Drake [id number] Date: 03/30/2019 Reason for consult: Initial assessment;Term   Maternal Data Does the patient have breastfeeding experience prior to this delivery?: Yes  Feeding Feeding Type: Breast Fed  LATCH Score Latch: Grasps breast easily, tongue down, lips flanged, rhythmical sucking.  Audible Swallowing: A few with stimulation  Type of Nipple: Everted at rest and after stimulation  Comfort (Breast/Nipple): Soft / non-tender  Hold (Positioning): No assistance needed to correctly position infant at breast.  LATCH Score: 9  Interventions Interventions: Breast feeding basics reviewed;Support pillows;Position options;Breast massage;Hand express;Breast compression  Lactation Tools Discussed/Used WIC Program: No   Consult Status Consult Status: Follow-up Date: 03/30/19(in pm) Follow-up type: In-patient    Theodoro Kalata 03/30/2019, 12:47 AM

## 2019-03-30 NOTE — Lactation Note (Signed)
This note was copied from a baby's chart. Lactation Consultation Note  Patient Name: Gabrielle Drake Date: 03/30/2019 Reason for consult: Follow-up assessment;Infant weight loss;Other (Comment);Term(per mom its been 12 years since my  last baby) Baby is 45 hours old  LC reviewed and updated the doc flow sheets per mom .  As LC entered the room baby latched on the left breast / cradle and  Shallow, when released slanted nipple.  LC offered to assist and added more pillows for moms back and under baby  For the right breast / football/ and worked on depth.  [place] suspects baby has gotten use of latching shallow without support.  And it took him awhile to be ok with latching deeper.  Baby fed for 15 mins with increased swallows.  Mom mentioned her nipples were alittle sore , no breakdown on either.  Just some areola edema - indicating the need for shells between feedings.  Per mom has a hand pump and DEBP at home.  LC recommended when she goes homes to apply warm moist cloth to breast for 20 mins and then breast massage . Hand express, prepump with hand pump, and reverse pressure ( as shown ) , firm support. Latch with compressions until swallows.  Sore nipple and engorgement prevention and tx reviewed.  LC instructed mom on the use of a shells and showed her how to use them .  LC reminded mom when her milk comes if the baby only feeds 1st breast, to release the 2nd breast down to comfort.  Storage of breast milk reviewed.  LC stressed the importance of STS feedings until the baby can stay awake for a feeding , back to birth weight, and gaining steadily.  Mom has a the [facility] resource pamphlet if needed and phone numbers.   Maternal Data Has patient been taught Hand Expression?: Yes Does the patient have breastfeeding experience prior to this delivery?: Yes  Feeding Feeding Type: Breast Fed  LATCH Score Latch: Grasps breast easily, tongue down, lips flanged, rhythmical  sucking.  Audible Swallowing: A few with stimulation(increased to 2)  Type of Nipple: Everted at rest and after stimulation  Comfort (Breast/Nipple): Soft / non-tender  Hold (Positioning): Assistance needed to correctly position infant at breast and maintain latch.  LATCH Score: 8  Interventions Interventions: Breast feeding basics reviewed;Assisted with latch;Skin to skin;Breast massage;Hand express;Breast compression;Adjust position;Support pillows;Position options;Shells  Lactation Tools Discussed/Used Tools: Shells Shell Type: Inverted WIC Program: No Pump Review: Milk Storage   Consult Status Consult Status: Complete Date: 03/30/19    Myer Haff 03/30/2019, 11:51 AM

## 2019-03-30 NOTE — Discharge Instructions (Signed)
Parto vaginal, cuidados de puerperio Postpartum Care After Vaginal Delivery Lea esta informacin sobre cmo cuidarse desde el momento en que nazca su beb y hasta 6 a 12 semanas despus del parto (perodo del posparto). El mdico tambin podr darle instrucciones ms especficas. Comunquese con su mdico si tiene problemas o preguntas. Siga estas indicaciones en su casa: Hemorragia vaginal  Es normal tener un poco de hemorragia vaginal (loquios) despus del parto. Use un apsito sanitario para el sangrado vaginal y secrecin. ? Durante la primera semana despus del parto, la cantidad y el aspecto de los loquios a menudo es similar a las del perodo menstrual. ? Durante las siguientes semanas disminuir gradualmente hasta convertirse en una secrecin seca amarronada o amarillenta. ? En la mayora de las mujeres, los loquios se detienen completamente entre 4 a 6semanas despus del parto. Los sangrados vaginales pueden variar de mujer a mujer.  Cambie los apsitos sanitarios con frecuencia. Observe si hay cambios en el flujo, como: ? Un aumento repentino en el volumen. ? Cambio en el color. ? Cogulos sanguneos grandes.  Si expulsa un cogulo de sangre por la vagina, gurdelo y llame al mdico para informrselo. No deseche los cogulos de sangre por el inodoro antes de hablar con su mdico.  No use tampones ni se haga duchas vaginales hasta que el mdico la autorice.  Si no est amamantando, volver a tener su perodo entre 6 y 8 semanas despus del parto. Si solamente alimenta al beb con leche materna (lactancia materna exclusiva), podra no volver a tener su perodo hasta que deje de amamantar. Cuidados perineales  Mantenga la zona entre la vagina y el ano (perineo) limpia y seca, como se lo haya indicado el mdico. Utilice apsitos o aerosoles analgsicos y cremas, como se lo hayan indicado.  Si le hicieron un corte en el perineo (episiotoma) o tuvo un desgarro en la vagina, controle la  zona para detectar signos de infeccin hasta que sane. Est atenta a los siguientes signos: ? Aumento del enrojecimiento, la hinchazn o el dolor. ? Presenta lquido o sangre que supura del corte o desgarro. ? Calor. ? Pus o mal olor.  Es posible que le den una botella rociadora para que use en lugar de limpiarse el rea con papel higinico despus de usar el bao. Cuando comience a sanar, podr usar la botella rociadora antes de secarse. Asegrese de secarse suavemente.  Para aliviar el dolor causado por una episiotoma, un desgarro en la vagina o venas hinchadas en el ano (hemorroides), trate de tomar un bao de asiento tibio 2 o 3 veces por da. Un bao de asiento es un bao de agua tibia que se toma mientras se est sentado. El agua solo debe llegar hasta las caderas y cubrir las nalgas. Cuidado de las mamas  En los primeros das despus del parto, las mamas pueden sentirse pesadas, llenas e incmodas (congestin mamaria). Tambin puede escaparse leche de sus senos. El mdico puede sugerirle mtodos para aliviar este malestar. La congestin mamaria debera desaparecer al cabo de unos das.  Si est amamantando: ? Use un sostn que sujete y ajuste bien sus pechos. ? Mantenga los pezones secos y limpios. Aplquese cremas y ungentos, como se lo haya indicado el mdico. ? Es posible que deba usar discos de algodn en el sostn para absorber la leche que se filtre de sus senos. ? Puede tener contracciones uterinas cada vez que amamante durante varias semanas despus del parto. Las contracciones uterinas ayudan al tero a   regresar a su tamao habitual. ? Si tiene algn problema con la lactancia materna, colabore con el mdico o un asesor en lactancia.  Si no est amamantando: ? Evite tocarse mucho las mamas. Al hacerlo, podran producir ms leche. ? Use un sostn que le proporcione el ajuste correcto y compresas fras para reducir la hinchazn. ? No extraiga (saque) leche materna. Esto har que  produzca ms leche. Intimidad y sexualidad  Pregntele al mdico cundo puede retomar la actividad sexual. Esto puede depender de lo siguiente: ? Su riesgo de sufrir infecciones. ? La rapidez con la que est sanando. ? Su comodidad y deseo de retomar la actividad sexual.  Despus del parto, puede quedar embarazada incluso si no ha tenido todava su perodo. Si lo desea, hable con el mdico acerca de los mtodos de control de la natalidad (mtodos anticonceptivos). Medicamentos  Tome los medicamentos de venta libre y los recetados solamente como se lo haya indicado el mdico.  Si le recetaron un antibitico, tmelo como se lo haya indicado el mdico. No deje de tomar el antibitico aunque comience a sentirse mejor. Actividad  Retome sus actividades normales de a poco como se lo haya indicado el mdico. Pregntele al mdico qu actividades son seguras para usted.  Descanse todo lo que pueda. Trate de descansar o tomar una siesta mientras el beb duerme. Comida y bebida   Beba suficiente lquido como para mantener la orina de color amarillo plido.  Coma alimentos ricos en fibras todos los das. Estos pueden ayudarla a prevenir o aliviar el estreimiento. Los alimentos ricos en fibras incluyen, entre otros: ? Panes y cereales integrales. ? Arroz integral. ? Frijoles. ? Frutas y verduras frescas.  No intente perder de peso rpidamente reduciendo el consumo de caloras.  Tome sus vitaminas prenatales hasta la visita de seguimiento de posparto o hasta que su mdico le indique que puede dejar de tomarlas. Estilo de vida  No consuma ningn producto que contenga nicotina o tabaco, como cigarrillos y cigarrillos electrnicos. Si necesita ayuda para dejar de fumar, consulte al mdico.  No beba alcohol, especialmente si est amamantando. Instrucciones generales  Concurra a todas las visitas de seguimiento para usted y el beb, como se lo haya indicado el mdico. La mayora de las mujeres  visita al mdico para un seguimiento de posparto dentro de las primeras 3 a 6 semanas despus del parto. Comunquese con un mdico si:  Se siente incapaz de controlar los cambios que implica tener un hijo y esos sentimientos no desaparecen.  Siente tristeza o preocupacin de forma inusual.  Las mamas se ponen rojas, le duelen o se endurecen.  Tiene fiebre.  Tiene dificultad para retener la orina o para impedir que la orina se escape.  Tiene poco inters o falta de inters en actividades que solan gustarle.  No ha amamantado nada y no ha tenido un perodo menstrual durante 12 semanas despus del parto.  Dej de amamantar al beb y no ha tenido su perodo menstrual durante 12 semanas despus de dejar de amamantar.  Tiene preguntas sobre su cuidado y el del beb.  Elimina un cogulo de sangre grande por la vagina. Solicite ayuda de inmediato si:  Siente dolor en el pecho.  Tiene dificultad para respirar.  Tiene un dolor repentino e intenso en la pierna.  Tiene dolor intenso o clicos en el la parte inferior del abdomen.  Tiene una hemorragia tan intensa de la vagina que empapa ms de un apsito en una hora. El sangrado   no debe ser ms abundante que el perodo ms intenso que haya tenido.  Dolor de cabeza intenso.  Se desmaya.  Tiene visin borrosa o manchas en la vista.  Tiene secrecin vaginal con mal olor.  Tiene pensamientos acerca de lastimarse a usted misma o a su beb. Si alguna vez siente que puede lastimarse a usted misma o a otras personas, o tiene pensamientos de poner fin a su vida, busque ayuda de inmediato. Puede dirigirse al departamento de emergencias ms cercano o llamar a:  El servicio de emergencias de su localidad (911 en [place]  Una lnea de asistencia al suicida y atencin en crisis, como la Lnea Nacional de Prevencin del Suicidio (National Suicide Prevention Lifeline), al 1-800-273-8255. Est disponible las 24 horas del da. Resumen  El  perodo de tiempo justo despus el parto y hasta 6 a 12 semanas despus del parto se denomina perodo posparto.  Retome sus actividades normales de a poco como se lo haya indicado el mdico.  Concurra a todas las visitas de seguimiento para usted y el beb, como se lo haya indicado el mdico. Esta informacin no tiene como fin reemplazar el consejo del mdico. Asegrese de hacerle al mdico cualquier pregunta que tenga. Document Released: 04/05/2007 Document Revised: 09/18/2017 Document Reviewed: 05/30/2017 Elsevier Patient Education  2020 Elsevier Inc.  

## 2020-05-08 ENCOUNTER — Other Ambulatory Visit: Payer: Self-pay | Admitting: Obstetrics and Gynecology

## 2020-05-08 DIAGNOSIS — Z1231 Encounter for screening mammogram for malignant neoplasm of breast: Secondary | ICD-10-CM

## 2020-05-21 ENCOUNTER — Ambulatory Visit: Payer: Self-pay

## 2020-06-11 ENCOUNTER — Ambulatory Visit: Payer: Self-pay | Admitting: *Deleted

## 2020-06-11 ENCOUNTER — Ambulatory Visit
Admission: RE | Admit: 2020-06-11 | Discharge: 2020-06-11 | Disposition: A | Discharge status: Home / Self Care | Payer: No Typology Code available for payment source | Source: Ambulatory Visit | Attending: Obstetrics and Gynecology | Admitting: Obstetrics and Gynecology

## 2020-06-11 ENCOUNTER — Other Ambulatory Visit: Payer: Self-pay

## 2020-06-11 VITALS — BP 122/88 | Wt 157.0 lb

## 2020-06-11 DIAGNOSIS — Z1231 Encounter for screening mammogram for malignant neoplasm of breast: Secondary | ICD-10-CM

## 2020-06-11 DIAGNOSIS — Z1239 Encounter for other screening for malignant neoplasm of breast: Secondary | ICD-10-CM

## 2020-06-11 NOTE — Patient Instructions (Signed)
Explained breast self awareness with [name]. Patient did not need a Pap smear today due to last Pap smear was in November 2020 per patient. Let her know [facility] will cover Pap smears every 3 years unless has a history of abnormal Pap smears. Referred patient to the [facility] Center of [facility] for a screening mammogram on the mobile unit. Appointment scheduled Tuesday, June 11, 2020 at 1350. Patient escorted to the mobile unit following B[facility]CP appointment for her screening mammogram. Let patient know the Breast Center will follow up with her within the next couple weeks with results of her mammogram by letter or phone. Gabrielle Drake verbalized understanding.  Gabrielle Drake, Gabrielle Maser, RN 12:33 PM

## 2020-06-11 NOTE — Progress Notes (Signed)
Gabrielle Drake is a 44 y.o. female who presents to [facility] clinic today with complaints of left breast pain when bending over.   Pap Smear: Pap smear not completed today. Last Pap smear was in November 2020 at the  [facility] clinic and was normal per patient. Per patient has no history of an abnormal Pap smear. Last Pap smear result is not available in Epic.   Physical exam: Breasts Breasts symmetrical. No skin abnormalities bilateral breasts. No nipple retraction bilateral breasts. No nipple discharge bilateral breasts. No lymphadenopathy. No lumps palpated bilateral breasts. No complaints of pain or tenderness on exam.       Pelvic/Bimanual Pap is not indicated today per BCCCP guidelines.   Smoking History: Patient has never smoked.  Patient Navigation: Patient education provided. Access to services provided for patient through [facility] program. Spanish interpreter Natale Lay from [facility] provided.    Breast and Cervical Cancer Risk Assessment: Patient has family history of her mother having breast cancer. Patient has no known genetic mutations or history of radiation treatment to the chest before age 65. Patient does not have history of cervical dysplasia, immunocompromised, or DES exposure in-utero.  Risk Assessment    Risk Scores      06/11/2020   Last edited by: Narda Rutherford, LPN   5-year risk: 0.9 %   Lifetime risk: 13.3 %         A: B[facility]CP exam without pap smear No complaints.  P: Referred patient to the [facility] of [facility] for a screening mammogram on the mobile unit. Appointment scheduled Tuesday, June 11, 2020 at 1350.  Priscille Heidelberg, RN 06/11/2020 12:33 PM

## 2021-11-30 IMAGING — MG DIGITAL SCREENING BILAT W/ TOMO W/ CAD
8 series · 8 of 24 positions shown · non-contrast
Comparison: Previous exam(s).

CLINICAL DATA: Screening.

EXAM:
DIGITAL SCREENING BILATERAL MAMMOGRAM WITH TOMO AND CAD

[R CC synth-2D]
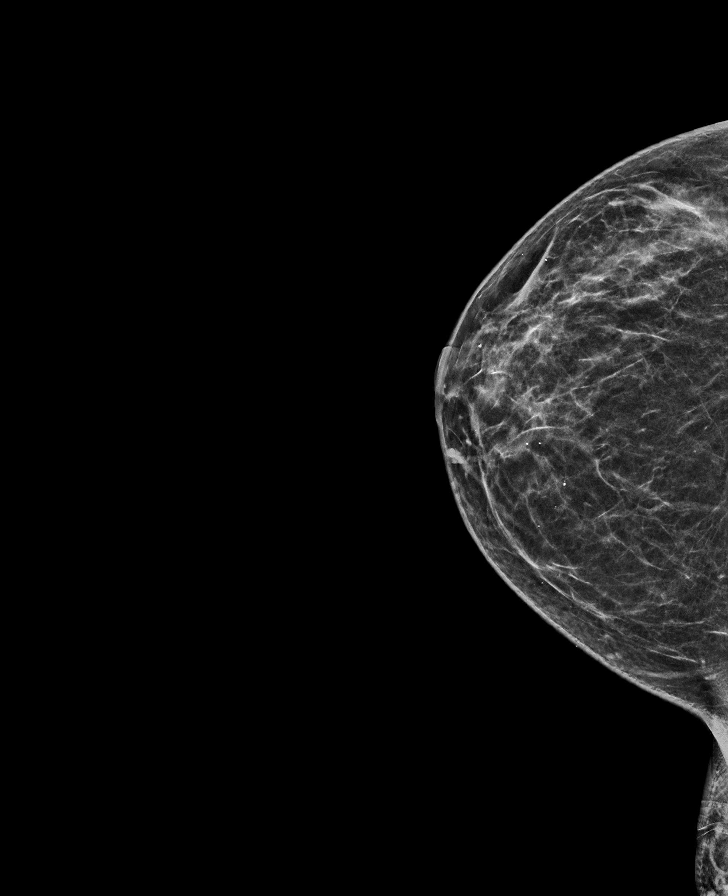

[L CC synth-2D]
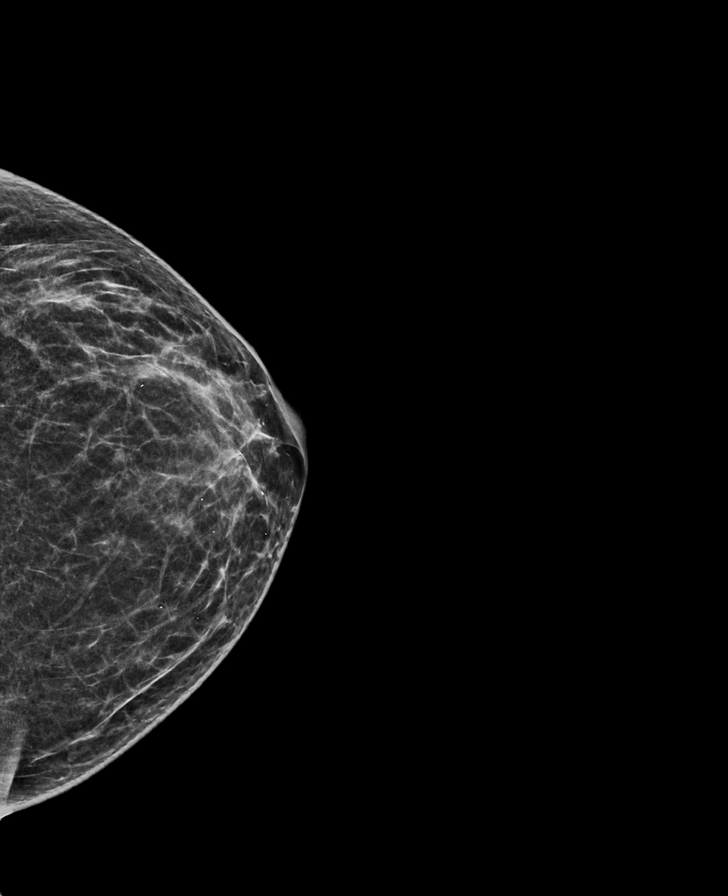

[L MLO synth-2D]
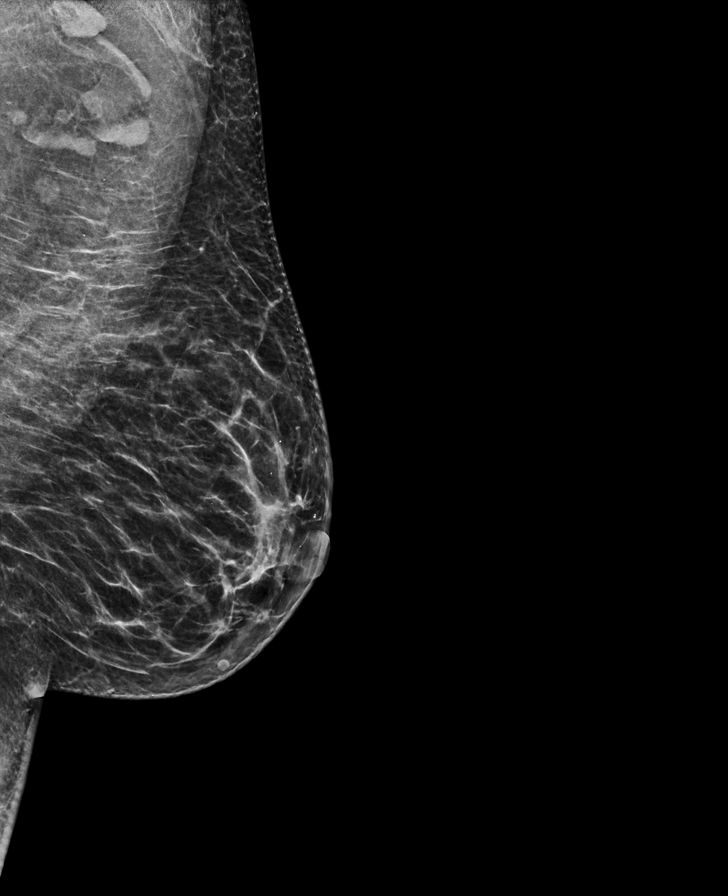

[R MLO synth-2D]
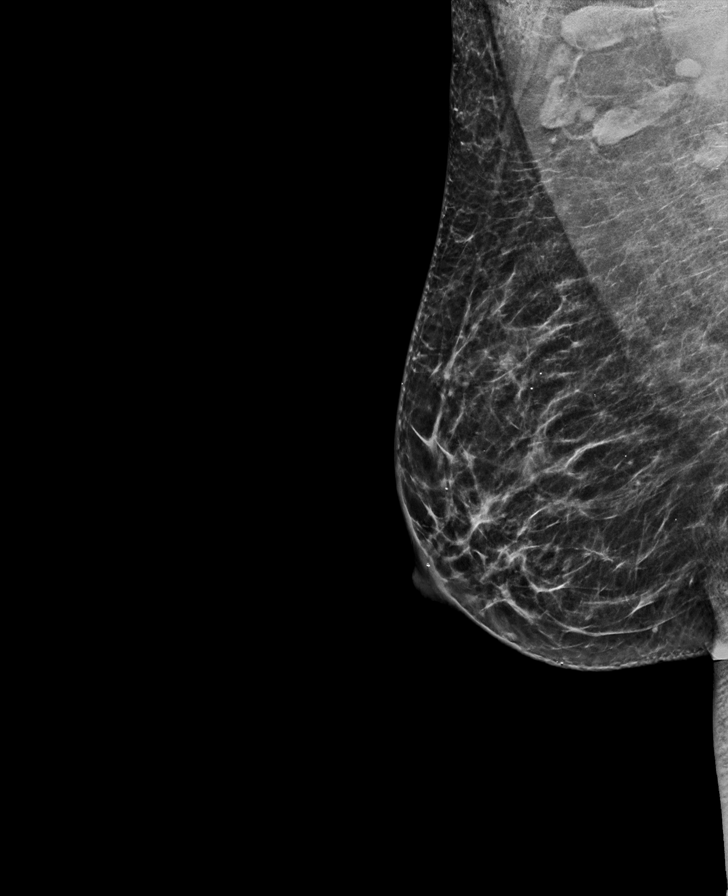

[R CC tomo · tomo slice 26/51.0]
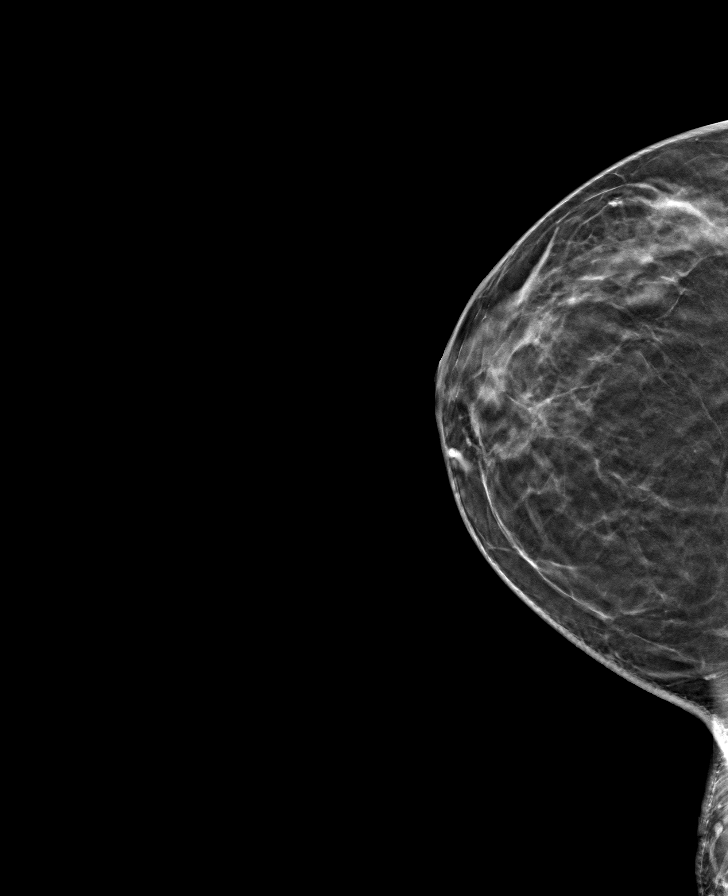

[L CC tomo · tomo slice 25/48.0]
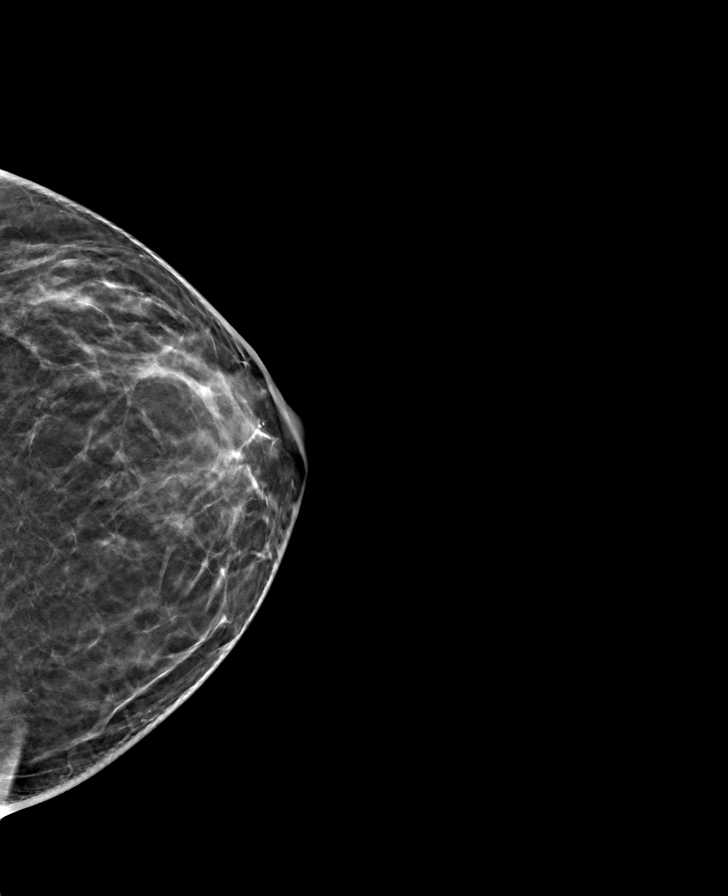

[R MLO tomo · tomo slice 31/60.0]
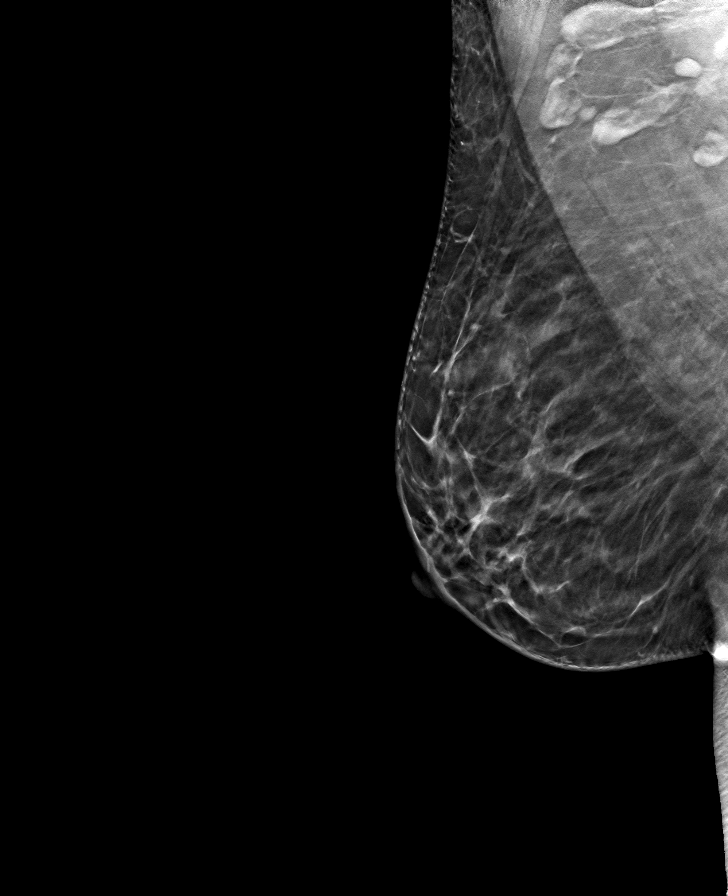

[L MLO tomo · tomo slice 29/56.0]
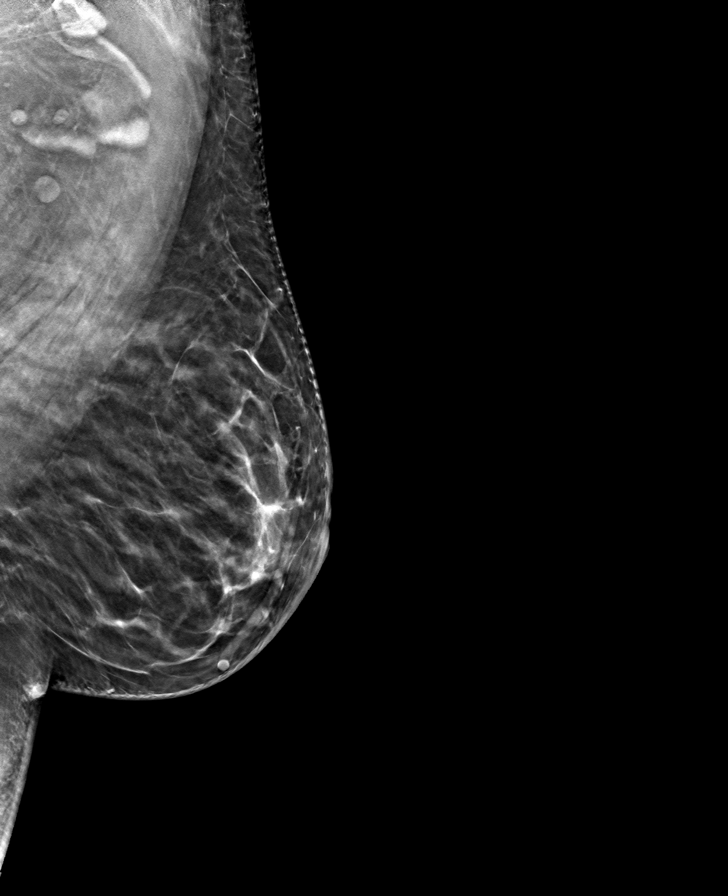

[8 of 24 positions shown; findings below may reference images not displayed]

ACR Breast Density Category b: There are scattered areas of
fibroglandular density.
FINDINGS: There are no findings suspicious for malignancy. Images were
processed with CAD.
IMPRESSION: No mammographic evidence of malignancy. A result letter of this
screening mammogram will be mailed directly to the patient.

RECOMMENDATION:
Screening mammogram in one year. (Code:CN-U-775)

BI-RADS CATEGORY  1: Negative.

## 2022-04-07 ENCOUNTER — Other Ambulatory Visit: Payer: Self-pay

## 2022-04-07 DIAGNOSIS — N6320 Unspecified lump in the left breast, unspecified quadrant: Secondary | ICD-10-CM

## 2022-04-30 ENCOUNTER — Ambulatory Visit: Payer: Self-pay | Admitting: Hematology and Oncology

## 2022-04-30 ENCOUNTER — Ambulatory Visit
Admission: RE | Admit: 2022-04-30 | Discharge: 2022-04-30 | Disposition: A | Discharge status: Home / Self Care | Payer: No Typology Code available for payment source | Source: Ambulatory Visit | Attending: Obstetrics and Gynecology | Admitting: Obstetrics and Gynecology

## 2022-04-30 ENCOUNTER — Ambulatory Visit: Payer: No Typology Code available for payment source

## 2022-04-30 VITALS — BP 148/88 | Wt 173.7 lb

## 2022-04-30 DIAGNOSIS — N644 Mastodynia: Secondary | ICD-10-CM

## 2022-04-30 DIAGNOSIS — N6320 Unspecified lump in the left breast, unspecified quadrant: Secondary | ICD-10-CM

## 2022-04-30 NOTE — Patient Instructions (Signed)
Taught Gabrielle Drake about breast self awareness. Patient did not need a Pap smear today due to last Pap smear was in 2020 per patient. Let her know [facility] will cover Pap smears every 5 years unless has a history of abnormal Pap smears. Referred patient to the [facility] of [facility] for diagnostic mammogram. Appointment scheduled for 04/30/2022. Patient aware of appointment and will be there. Let patient know will follow up with her within the next couple weeks with results. Gabrielle Drake verbalized understanding.  Pascal Lux, NP 1:54 PM

## 2022-04-30 NOTE — Progress Notes (Addendum)
Gabrielle Drake is a 46 y.o. female who presents to [facility] clinic today with complaint of left breast pain.    Pap Smear: Pap not smear completed today. Last Pap smear was 2020 at [facility] clinic and was normal. Per patient has no history of an abnormal Pap smear. Last Pap smear result is not available in Epic.   Physical exam: Breasts Breasts symmetrical. No skin abnormalities bilateral breasts. No nipple retraction bilateral breasts. No nipple discharge bilateral breasts. No lymphadenopathy. No lumps palpated bilateral breasts.     MS DIGITAL SCREENING TOMO BILATERAL  Result Date: 06/13/2020 CLINICAL DATA:  Screening. EXAM: DIGITAL SCREENING BILATERAL MAMMOGRAM WITH TOMO AND CAD COMPARISON:  Previous exam(s). ACR Breast Density Category b: There are scattered areas of fibroglandular density. FINDINGS: There are no findings suspicious for malignancy. Images were processed with CAD. IMPRESSION: No mammographic evidence of malignancy. A result letter of this screening mammogram will be mailed directly to the patient. RECOMMENDATION: Screening mammogram in one year. (Code:SM-B-01Y) BI-RADS CATEGORY  1: Negative. Electronically Signed   By: Edwin Cap M.D.   On: 06/13/2020 13:42   MS DIGITAL SCREENING TOMO BILATERAL  Result Date: 05/24/2017 CLINICAL DATA:  Screening. EXAM: 2D DIGITAL SCREENING BILATERAL MAMMOGRAM WITH CAD AND ADJUNCT TOMO COMPARISON:  None. ACR Breast Density Category b: There are scattered areas of fibroglandular density. FINDINGS: There are no findings suspicious for malignancy. Images were processed with CAD. IMPRESSION: No mammographic evidence of malignancy. A result letter of this screening mammogram will be mailed directly to the patient. RECOMMENDATION: Screening mammogram in one year. (Code:SM-B-01Y) BI-RADS CATEGORY  1: Negative. Electronically Signed   By: Sherian Rein M.D.   On: 05/24/2017 09:51      Pelvic/Bimanual Pap is not indicated today    Smoking  History: Patient has never smoked and was referred to quit line.    Patient Navigation: Patient education provided. Access to services provided for patient through [facility] program. No interpreter provided. No transportation provided   Colorectal Cancer Screening: Per patient has never had colonoscopy completed No complaints today.    Breast and Cervical Cancer Risk Assessment: Patient does not have family history of breast cancer, known genetic mutations, or radiation treatment to the chest before age 5. Patient does not have history of cervical dysplasia, immunocompromised, or DES exposure in-utero.  Risk Scores as of 04/30/2022     [name]           5-year 1.3 %   Lifetime 15.88 %   This patient is [place]/Lat[place] but has no documented birth country, so the [place] model used data from [employer] patients to calculate their risk score. Document a birth country in the Demographics activity for a more accurate score.         Last calculated by Caprice Red, CMA on 04/30/2022 at  1:33 PM        A: BCCCP exam without pap smear Complaint of left breast pain that has since resolved with benign exam.   P: Referred patient to the [facility] of [facility] for a diagnostic mammogram. Appointment scheduled 04/30/2022.  Gabrielle Drake A, NP 04/30/2022 1:51 PM

## 2022-06-01 ENCOUNTER — Ambulatory Visit: Payer: No Typology Code available for payment source

## 2022-06-01 DIAGNOSIS — Z124 Encounter for screening for malignant neoplasm of cervix: Secondary | ICD-10-CM

## 2022-07-01 ENCOUNTER — Other Ambulatory Visit: Payer: Self-pay | Admitting: Hematology and Oncology

## 2022-07-01 DIAGNOSIS — Z01419 Encounter for gynecological examination (general) (routine) without abnormal findings: Secondary | ICD-10-CM

## 2022-07-01 NOTE — Progress Notes (Signed)
Patient: Gabrielle Drake           Date of Birth: Nov 19, 1975           MRN: [id number] Visit Date: 07/01/2022 PCP: Osborne Oman, MD  47 yo with history of normal Pap smear with no HPV in 2020.   Cervical Exam  Abnormal Observations: Normal exam with mild yeast noted. Pap obtained.       Patient's History Patient Active Problem List   Diagnosis Date Noted   Indication for care in labor or delivery 03/28/2019   Past Medical History:  Diagnosis Date   No pertinent past medical history     Family History  Problem Relation Age of Onset   Breast cancer Mother 65    Social History   Occupational History   Not on file  Tobacco Use   Smoking status: Never   Smokeless tobacco: Never  Vaping Use   Vaping Use: Never used  Substance and Sexual Activity   Alcohol use: Yes    Comment: occasionally   Drug use: Never   Sexual activity: Yes    Birth control/protection: Condom

## 2022-07-02 LAB — CYTOLOGY - PAP
Adequacy: ABSENT
Comment: NEGATIVE
Diagnosis: NEGATIVE
High risk HPV: NEGATIVE

## 2024-06-08 ENCOUNTER — Ambulatory Visit: Payer: Self-pay
